# Patient Record
Sex: Female | Born: 1995 | Race: White | Hispanic: No | Marital: Single | Smoking: Never smoker
Health system: Southern US, Community
[De-identification: ages and names within clinical notes are randomized; demographics above are authoritative.]

---
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
## 2013-12-03 ENCOUNTER — Other Ambulatory Visit (HOSPITAL_COMMUNITY): Payer: Self-pay | Admitting: Obstetrics and Gynecology

## 2013-12-03 DIAGNOSIS — Q898 Other specified congenital malformations: Secondary | ICD-10-CM

## 2013-12-10 ENCOUNTER — Other Ambulatory Visit (HOSPITAL_COMMUNITY): Payer: Self-pay | Admitting: Radiology

## 2013-12-10 ENCOUNTER — Ambulatory Visit (HOSPITAL_COMMUNITY)
Admission: RE | Admit: 2013-12-10 | Discharge: 2013-12-10 | Disposition: A | Discharge status: Home / Self Care | Payer: Federal, State, Local not specified - PPO | Source: Ambulatory Visit | Attending: Obstetrics and Gynecology | Admitting: Obstetrics and Gynecology

## 2013-12-10 ENCOUNTER — Ambulatory Visit (HOSPITAL_BASED_OUTPATIENT_CLINIC_OR_DEPARTMENT_OTHER): Payer: Federal, State, Local not specified - PPO | Admitting: Radiology

## 2013-12-10 DIAGNOSIS — R011 Cardiac murmur, unspecified: Secondary | ICD-10-CM | POA: Insufficient documentation

## 2013-12-10 DIAGNOSIS — R012 Other cardiac sounds: Secondary | ICD-10-CM

## 2013-12-10 DIAGNOSIS — Z13820 Encounter for screening for osteoporosis: Secondary | ICD-10-CM | POA: Insufficient documentation

## 2013-12-10 DIAGNOSIS — Q898 Other specified congenital malformations: Secondary | ICD-10-CM | POA: Insufficient documentation

## 2013-12-10 NOTE — Progress Notes (Signed)
Echocardiogram Performed.
